# Patient Record
Sex: Male | Born: 1945 | Race: White | Hispanic: No | Marital: Married | Smoking: Former smoker
Health system: Southern US, Community
[De-identification: ages and names within clinical notes are randomized; demographics above are authoritative.]

## PROBLEM LIST (undated history)

## (undated) DIAGNOSIS — I10 Essential (primary) hypertension: Secondary | ICD-10-CM

## (undated) HISTORY — PX: ACOUSTIC NEUROMA RESECTION: SHX5713

## (undated) HISTORY — PX: EYE SURGERY: SHX253

## (undated) HISTORY — PX: MEDIAL PARTIAL KNEE REPLACEMENT: SHX5965

## (undated) HISTORY — DX: Essential (primary) hypertension: I10

## (undated) HISTORY — PX: SHOULDER SURGERY: SHX246

---

## 2019-01-26 ENCOUNTER — Other Ambulatory Visit: Payer: Self-pay

## 2019-01-26 ENCOUNTER — Ambulatory Visit: Payer: Self-pay | Attending: Internal Medicine

## 2019-01-26 DIAGNOSIS — Z20822 Contact with and (suspected) exposure to covid-19: Secondary | ICD-10-CM

## 2019-01-26 DIAGNOSIS — Z20828 Contact with and (suspected) exposure to other viral communicable diseases: Secondary | ICD-10-CM | POA: Insufficient documentation

## 2019-01-27 NOTE — Progress Notes (Signed)
Order(s) created erroneously. Erroneous order ID: [id number]  Order moved by: Brigitte Pulse  Order move date/time: 01/27/2019 2:39 PM  Source Patient: [id number]  Source Contact: 01/26/2019  Destination Patient: [id number]  Destination Contact: 01/26/2019

## 2019-01-27 NOTE — Progress Notes (Signed)
Order(s) created erroneously. Erroneous order ID: [id number]  Order moved by: Haru Shaff M  Order move date/time: 01/27/2019 2:39 PM  Source Patient: [id number]  Source Contact: 01/26/2019  Destination Patient: [id number]  Destination Contact: 01/26/2019 

## 2019-01-27 NOTE — Progress Notes (Signed)
Orders moved to this encounter.  

## 2019-01-28 LAB — NOVEL CORONAVIRUS, NAA: SARS-CoV-2, NAA: NOT DETECTED

## 2019-11-27 ENCOUNTER — Ambulatory Visit: Payer: Self-pay | Attending: Internal Medicine

## 2019-11-27 ENCOUNTER — Ambulatory Visit: Payer: Self-pay

## 2019-11-27 DIAGNOSIS — Z23 Encounter for immunization: Secondary | ICD-10-CM

## 2019-11-27 NOTE — Progress Notes (Signed)
   Covid-19 Vaccination Clinic  Name:  Angel Henry    MRN: [id number] DOB: 09-07-1945  11/27/2019  [name] was observed post Covid-19 immunization for 15 minutes without incident. He was provided with Vaccine Information Sheet and instruction to access the V-Safe system.   [name] was instructed to call 911 with any severe reactions post vaccine: Marland Kitchen Difficulty breathing  . Swelling of face and throat  . A fast heartbeat  . A bad rash all over body  . Dizziness and weakness

## 2020-04-29 ENCOUNTER — Ambulatory Visit (INDEPENDENT_AMBULATORY_CARE_PROVIDER_SITE_OTHER): Payer: Medicare Other | Admitting: Internal Medicine

## 2020-04-29 VITALS — BP 156/74 | HR 63 | Temp 98.8°F | Resp 18 | Ht 66.0 in | Wt 242.0 lb

## 2020-04-29 DIAGNOSIS — Z6839 Body mass index (BMI) 39.0-39.9, adult: Secondary | ICD-10-CM

## 2020-04-29 DIAGNOSIS — Z7189 Other specified counseling: Secondary | ICD-10-CM

## 2020-04-29 DIAGNOSIS — G4733 Obstructive sleep apnea (adult) (pediatric): Secondary | ICD-10-CM | POA: Diagnosis not present

## 2020-04-29 DIAGNOSIS — E669 Obesity, unspecified: Secondary | ICD-10-CM | POA: Diagnosis not present

## 2020-04-29 DIAGNOSIS — Z9989 Dependence on other enabling machines and devices: Secondary | ICD-10-CM

## 2020-04-29 NOTE — Patient Instructions (Signed)

## 2020-04-29 NOTE — Progress Notes (Signed)
[facility] [address] [place], [place] [address]  Pulmonary Sleep Medicine   Office Visit Note  Patient Name: Angel Henry DOB: July 25, 1945 MRN [id number]    Chief Complaint: Obstructive Sleep Apnea visit  Brief History:  [name] is seen today for follow up  The patient has a 14 history of sleep apnea. Patient is using PAP most nights. He is still falling asleep in the living room but this has improved. The patient feels more rested after sleeping with PAP.  The patient reports benefiting from PAP use. Reported sleepiness is  imiproved and the Epworth Sleepiness Score is 10 out of 24. The patient does not take naps. The patient complains of the following: the headgear slipes  The compliance download shows good compliance with an average use time of 7.2 hours. The AHI is 2.4  The patient does not complain of limb movements disrupting sleep.  ROS  General: (-) fever, (-) chills, (-) night sweat Nose and Sinuses: (-) nasal stuffiness or itchiness, (-) postnasal drip, (-) nosebleeds, (-) sinus trouble. Mouth and Throat: (-) sore throat, (-) hoarseness. Neck: (-) swollen glands, (-) enlarged thyroid, (-) neck pain. Respiratory: - cough, - shortness of breath, - wheezing. Neurologic: - numbness, - tingling. Psychiatric: he is on antidepressant    Current Medication: Outpatient Encounter Medications as of 04/29/2020  Medication Sig   Cholecalciferol 25 MCG (1000 UT) tablet Take by mouth.   citalopram (CELEXA) 10 MG/5ML suspension Take by mouth.   tamsulosin (FLOMAX) 0.4 MG CAPS capsule tamsulosin 0.4 mg capsule   No facility-administered encounter medications on file as of 04/29/2020.    Surgical History: Past Surgical History:  Procedure Laterality Date   ACOUSTIC NEUROMA RESECTION     MEDIAL PARTIAL KNEE REPLACEMENT     SHOULDER SURGERY      Medical History: Past Medical History:  Diagnosis Date   Hypertension     Family History: Non contributory to  the present illness  Social History: Social History   Socioeconomic History   Marital status: Married    Spouse name: Not on file   Number of children: Not on file   Years of education: Not on file   Highest education level: Not on file  Occupational History   Not on file  Tobacco Use   Smoking status: Former Smoker   Smokeless tobacco: Never Used  Substance and Sexual Activity   Alcohol use: Not on file   Drug use: Not on file   Sexual activity: Not on file  Other Topics Concern   Not on file  Social History Narrative   Not on file   Social Determinants of Health   Financial Resource Strain: Not on file  Food Insecurity: Not on file  Transportation Needs: Not on file  Physical Activity: Not on file  Stress: Not on file  Social Connections: Not on file  Intimate Partner Violence: Not on file    Vital Signs: Blood pressure (!) 156/74, pulse 63, temperature 98.8 F (37.1 C), temperature source Temporal, resp. rate 18, height 5\' 6"  (1.676 m), weight 242 lb (109.8 kg), SpO2 95 %.  Examination: General Appearance: The patient is well-developed, well-nourished, and in no distress. Neck Circumference: 48 Skin: Gross inspection of skin unremarkable. Head: normocephalic, no gross deformities. Eyes: no gross deformities noted. ENT: ears appear grossly normal Neurologic: Alert and oriented. No involuntary movements.    EPWORTH SLEEPINESS SCALE:  Scale:  (0)= no chance of dozing; (1)= slight chance of dozing; (2)= moderate chance  of dozing; (3)= high chance of dozing  Chance  Situtation    Sitting and reading: 1    Watching TV: 2    Sitting Inactive in public: 2    As a passenger in car: 1      Lying down to rest: 2    Sitting and talking: 1    Sitting quielty after lunch: 1    In a car, stopped in traffic: 0   TOTAL SCORE:   10 out of 24    SLEEP STUDIES:  1. PSG (01/18/06) - AHI of 88;  Low SpO2 @ 64%   CPAP COMPLIANCE  DATA:  Date Range: 04/27/19 - 04/25/20  Average Daily Use: 6.. hours  Median Use: 6:20  Compliance for > 4 Hours: 80%  AHI: 2.4 respiratory events per hour  Days Used: 306/365  Mask Leak: 47.1  95th Percentile Pressure: 11 cmH2O         LABS: No results found for this or any previous visit (from the past 2160 hour(s)).  Radiology: Patient was never admitted.  No results found.  No results found.    Assessment and Plan: Patient Active Problem List   Diagnosis Date Noted   OSA on CPAP 04/29/2020   CPAP use counseling 04/29/2020   Class 2 severe obesity with serious comorbidity and body mass index (BMI) of 39.0 to 39.9 in adult (HCC) 04/29/2020   1. OSA on CPAP The patient does tolerate PAP and reports significant benefit from PAP use. The patient was reminded how to clean the CPAP. The patient was also counselled to use a timer when watching TV in the living room to prevent sleeping without the mask  He was advised to fit the mask on his side so he can  Sleep on his side without mask leak.The compliance has been good and the apnea is controlled OSA- continue to aim for nightly use of CPAP  2. CPAP use counseling CPAP Counseling: had a lengthy discussion with the patient regarding the importance of PAP therapy in management of the sleep apnea. Patient appears to understand the risk factor reduction and also understands the risks associated with untreated sleep apnea. Patient will try to make a good faith effort to remain compliant with therapy. Also instructed the patient on proper cleaning of the device including the water must be changed daily if possible and use of distilled water is preferred. Patient understands that the machine should be regularly cleaned with appropriate recommended cleaning solutions that do not damage the PAP machine for example given white vinegar and water rinses. Other methods such as ozone treatment may not be as good as these simple methods  to achieve cleaning.  3. Class 2 severe obesity with serious comorbidity and body mass index (BMI) of 39.0 to 39.9 in adult, unspecified obesity type (HCC) Obesity Counseling: Had a lengthy discussion regarding patients BMI and weight issues. Patient was instructed on portion control as well as increased activity. Also discussed caloric restrictions with trying to maintain intake less than 2000 Kcal. Discussions were made in accordance with the 5As of weight management. Simple actions such as not eating late and if able to, taking a walk is suggested.  General Counseling: I have discussed the findings of the evaluation and examination with Wallie.  I have also discussed any further diagnostic evaluation thatmay be needed or ordered today. Josip verbalizes understanding of the findings of todays visit. We also reviewed his medications today and discussed drug interactions and side effects  including but not limited excessive drowsiness and altered mental states. We also discussed that there is always a risk not just to him but also people around him. he has been encouraged to call the office with any questions or concerns that should arise related to todays visit.  No orders of the defined types were placed in this encounter.       I have personally obtained a history, examined the patient, evaluated laboratory and imaging results, formulated the assessment and plan and placed orders.  This patient was seen today by Emmaline Kluver, PA-C in collaboration with Dr. Freda Munro.  Valentino Hue Sol Blazing, PhD, FAASM  Diplomate, American Board of Sleep Medicine    Yevonne Pax, MD [facility] Diplomate ABMS Pulmonary and Critical Care Medicine Sleep medicine

## 2021-01-09 DIAGNOSIS — M179 Osteoarthritis of knee, unspecified: Secondary | ICD-10-CM | POA: Insufficient documentation

## 2021-01-13 ENCOUNTER — Other Ambulatory Visit: Payer: Self-pay | Admitting: Orthopedic Surgery

## 2021-01-13 ENCOUNTER — Other Ambulatory Visit (HOSPITAL_COMMUNITY): Payer: Self-pay | Admitting: Orthopedic Surgery

## 2021-01-13 DIAGNOSIS — M4807 Spinal stenosis, lumbosacral region: Secondary | ICD-10-CM

## 2021-01-24 ENCOUNTER — Other Ambulatory Visit: Payer: Self-pay

## 2021-01-24 ENCOUNTER — Ambulatory Visit
Admission: RE | Admit: 2021-01-24 | Discharge: 2021-01-24 | Disposition: A | Discharge status: Home / Self Care | Payer: Medicare Other | Source: Ambulatory Visit | Attending: Orthopedic Surgery | Admitting: Orthopedic Surgery

## 2021-01-24 DIAGNOSIS — M4807 Spinal stenosis, lumbosacral region: Secondary | ICD-10-CM

## 2021-04-28 ENCOUNTER — Ambulatory Visit (INDEPENDENT_AMBULATORY_CARE_PROVIDER_SITE_OTHER): Payer: Medicare Other | Admitting: Internal Medicine

## 2021-04-28 VITALS — BP 150/70 | HR 54 | Resp 16 | Ht 66.0 in | Wt 252.0 lb

## 2021-04-28 DIAGNOSIS — G4733 Obstructive sleep apnea (adult) (pediatric): Secondary | ICD-10-CM

## 2021-04-28 DIAGNOSIS — Z9989 Dependence on other enabling machines and devices: Secondary | ICD-10-CM | POA: Diagnosis not present

## 2021-04-28 DIAGNOSIS — Z7189 Other specified counseling: Secondary | ICD-10-CM | POA: Diagnosis not present

## 2021-04-28 NOTE — Patient Instructions (Signed)

## 2021-04-28 NOTE — Progress Notes (Signed)
[facility] ?[address] ?[place], [place] [address] ? ?Pulmonary Sleep Medicine  ? ?Office Visit Note ? ?Patient Name: Angel Henry ?DOB: Oct 09, 1945 ?MRN [id number] ? ? ? ?Chief Complaint: Obstructive Sleep Apnea visit ? ?Brief History: ? ?[name] is seen today for follow up visit. The patient has a 15 year history of sleep apnea. Patient is mostly using PAP nightly.  The patient feels a whole lot better after sleeping with PAP on CPAP @ 11cmH2O on [place] pillows mask, no chinstrap..  The patient reports benefiting from PAP use. Reported sleepiness is  resolved and the Epworth Sleepiness Score is 3 out of 24. The patient does not take naps. The patient complains of the following: headgear slips off head and some leak, but not too bad &  questions about replacement  The compliance download shows  compliance with an average use time of 5:48 hours @ 79%. The AHI is 3.4  The patient does not complain of limb movements disrupting sleep. ? ?ROS ? ?General: (-) fever, (-) chills, (-) night sweat ?Nose and Sinuses: (-) nasal stuffiness or itchiness, (-) postnasal drip, (-) nosebleeds, (-) sinus trouble. ?Mouth and Throat: (-) sore throat, (-) hoarseness. ?Neck: (-) swollen glands, (-) enlarged thyroid, (-) neck pain. ?Respiratory: - cough, - shortness of breath, - wheezing. ?Neurologic: - numbness, - tingling. ?Psychiatric: - anxiety, + depression ? ? ?Current Medication: ?Outpatient Encounter Medications as of 04/28/2021  ?Medication Sig  ? Cholecalciferol 25 MCG (1000 UT) tablet Take by mouth.  ? citalopram (CELEXA) 10 MG/5ML suspension Take by mouth.  ? citalopram (CELEXA) 20 MG tablet Take 30 mg by mouth every morning.  ? meloxicam (MOBIC) 15 MG tablet Take 15 mg by mouth daily.  ? tamsulosin (FLOMAX) 0.4 MG CAPS capsule tamsulosin 0.4 mg capsule  ? ?No facility-administered encounter medications on file as of 04/28/2021.  ? ? ?Surgical History: ?Past Surgical History:  ?Procedure Laterality Date  ? ACOUSTIC  NEUROMA RESECTION    ? MEDIAL PARTIAL KNEE REPLACEMENT    ? SHOULDER SURGERY    ? ? ?Medical History: ?Past Medical History:  ?Diagnosis Date  ? Hypertension   ? ? ?Family History: ?Non contributory to the present illness ? ?Social History: ?Social History  ? ?Socioeconomic History  ? Marital status: Married  ?  Spouse name: Not on file  ? Number of children: Not on file  ? Years of education: Not on file  ? Highest education level: Not on file  ?Occupational History  ? Not on file  ?Tobacco Use  ? Smoking status: Former  ? Smokeless tobacco: Never  ?Substance and Sexual Activity  ? Alcohol use: Not on file  ? Drug use: Not on file  ? Sexual activity: Not on file  ?Other Topics Concern  ? Not on file  ?Social History Narrative  ? Not on file  ? ?Social Determinants of Health  ? ?Financial Resource Strain: Not on file  ?Food Insecurity: Not on file  ?Transportation Needs: Not on file  ?Physical Activity: Not on file  ?Stress: Not on file  ?Social Connections: Not on file  ?Intimate Partner Violence: Not on file  ? ? ?Vital Signs: ?Blood pressure (!) 150/70, pulse (!) 54, resp. rate 16, height 5\' 6"  (1.676 m), weight 252 lb (114.3 kg), SpO2 98 %. ?Body mass index is 40.67 kg/m?.  ? ? ?Examination: ?General Appearance: The patient is well-developed, well-nourished, and in no distress. ?Neck Circumference: 48 cm ?Skin: Gross inspection of skin unremarkable. ?Head: normocephalic,  no gross deformities. ?Eyes: no gross deformities noted. ?ENT: ears appear grossly normal ?Neurologic: Alert and oriented. No involuntary movements. ? ? ? ?EPWORTH SLEEPINESS SCALE: ? ?Scale:  ?(0)= no chance of dozing; (1)= slight chance of dozing; (2)= moderate chance of dozing; (3)= high chance of dozing ? ?Chance  Situtation ?   ?Sitting and reading: 1 ?  ? Watching TV: 1 ?   ?Sitting Inactive in public: 0 ?   ?As a passenger in car: 0   ?   ?Lying down to rest: 1 ?   ?Sitting and talking: 0 ?   ?Sitting quielty after lunch: 0 ?   ?In a car,  stopped in traffic: 0 ? ? ?TOTAL SCORE:   3 out of 24 ? ? ? ?SLEEP STUDIES: ? ?PSG (01/18/06) - AHI of 88;  Low SpO2 @ 64% ? ? ?CPAP COMPLIANCE DATA: ? ?Date Range: 04/28/20 - 04/27/21 ? ?Average Daily Use: 5:48 hours ? ?Median Use: 7:11 hours ? ?Compliance for > 4 Hours: 79% days ? ?AHI: 3.4 respiratory events per hour ? ?Days Used: 302/365 ? ?Mask Leak: 43.6 lpm ? ?95th Percentile Pressure: 11 cmH2O ? ? ? ? ? ? ? ? ?LABS: ?No results found for this or any previous visit (from the past 2160 hour(s)). ? ?Radiology: ?MR LUMBAR SPINE WO CONTRAST ? ?Result Date: 01/26/2021 ?CLINICAL DATA:  Bilateral hip pain, left greater than right. Right groin pain. Chronic low back pain. Spinal stenosis of the lumbosacral region. EXAM: MRI LUMBAR SPINE WITHOUT CONTRAST TECHNIQUE: Multiplanar, multisequence MR imaging of the lumbar spine was performed. No intravenous contrast was administered. COMPARISON:  None. FINDINGS: Segmentation: 5 non rib-bearing lumbar type vertebral bodies are present. The lowest fully formed vertebral body is L5. Alignment: Slight retrolisthesis present at L1-2, L2-3 and L5-S1. Rightward curvature is centered at L3. Vertebrae: Mild fatty endplate marrow changes are present at L4-5 diffusely and on the far left L5-S1. Mild edema is present on the right at L2-3. Vertebral body heights are maintained. Conus medullaris and cauda equina: Conus extends to the L1-2 level. Conus and cauda equina appear normal. Paraspinal and other soft tissues: Posterior right renal cyst with a fluid level measures 2.4 cm. No other focal lesions are present. No significant adenopathy is present. Disc levels: L1-2: A broad-based disc protrusion is present. Disc material extends above and below the disc space, spanning 17 mm cephalo caudad. This results in moderate central canal stenosis with crowding of the nerve roots. Moderate foraminal narrowing is present bilaterally. L2-3: A leftward disc protrusion is present. Moderate facet  hypertrophy present. Crowding of the nerve roots is noted. Moderate left and mild right subarticular narrowing is present. Moderate foraminal narrowing is symmetric. L3-4: A broad-based disc protrusion is present. Advanced facet hypertrophy is noted. Moderate to severe central canal stenosis is present with near complete loss of fluid signal at the disc level. Moderate foraminal narrowing is present bilaterally. L4-5: A broad-based disc protrusion is present. Moderate subarticular scratched at moderate left and mild right subarticular narrowing is present without nerve root crowding as at the above levels. Moderate foraminal narrowing is metric. Facet spurring contributes, right greater than left. L5-S1: Chronic loss of disc height is present. Endplate spurring is noted. Facet spur tearing scratched at facet spurring contributes to moderate foraminal narrowing, right greater than left. IMPRESSION: 1. 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 Scoliosis is convex to the right. 2. 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 3. Moderate left and mild right subarticular stenosis at L2-3 with moderate foraminal narrowing bilaterally. 4. Symmetric moderate foraminal stenosis bilaterally at L2-3, L3-4 and L4-5 5. Moderate foraminal narrowing bilaterally at L5-S1 is worse on the right. Electronically Signed   By: San Morelle M.D.   On: 01/26/2021 07:12  ? ? ?No results found. ? ?No results found. ? ? ? ?Assessment and Plan: ?Patient Active Problem List  ? Diagnosis Date Noted  ? Morbid obesity ([place]) 04/28/2021  ? OSA on CPAP 04/29/2020  ? CPAP use counseling 04/29/2020  ? Class 2 severe obesity with serious comorbidity and body mass index (BMI) of 39.0 to 39.9 in adult [facility]) 04/29/2020  ? ?1. OSA on CPAP ?The patient does tolerate PAP and reports  benefit from PAP use. Machine has reached end of life and must be  replaced. The patient was reminded how to clean equipment and advised to replace supplies routinely. The patient was also counselled on weight loss. The compliance is good. The AHI is 3.4 ? ? ?OSA- rep

## 2021-07-21 ENCOUNTER — Ambulatory Visit (INDEPENDENT_AMBULATORY_CARE_PROVIDER_SITE_OTHER): Payer: Medicare Other | Admitting: Internal Medicine

## 2021-07-21 VITALS — BP 146/75 | HR 54 | Resp 16 | Ht 68.0 in | Wt 245.0 lb

## 2021-07-21 DIAGNOSIS — G4733 Obstructive sleep apnea (adult) (pediatric): Secondary | ICD-10-CM | POA: Diagnosis not present

## 2021-07-21 DIAGNOSIS — Z9989 Dependence on other enabling machines and devices: Secondary | ICD-10-CM

## 2021-07-21 DIAGNOSIS — Z7189 Other specified counseling: Secondary | ICD-10-CM | POA: Diagnosis not present

## 2021-07-21 NOTE — Progress Notes (Signed)
[facility] [place], [place] [address]  Pulmonary Sleep Medicine   Office Visit Note  Patient Name: Angel Henry DOB: Jul 25, 1945 MRN [id number]    Chief Complaint: Obstructive Sleep Apnea visit  Brief History:  Angel Henry is seen today for follow up. The patient has a 16 year history of sleep apnea. Patient is mostly using PAP nightly with medium N20 nasal mask..  The patient feels better after sleeping with PAP.  Epworth Sleepiness Score is 2 out of 24. The patient does not take naps. The patient complains of the following: frequent stuffy nose  and discomfort with full face.  The compliance download shows  compliance with an average use time of 5:20 hours. @ 80% The AHI is 5.6  The patient does not complain of limb movements disrupting sleep.  ROS  General: (-) fever, (-) chills, (-) night sweat Nose and Sinuses: (-) nasal stuffiness or itchiness, (-) postnasal drip, (-) nosebleeds, (-) sinus trouble. Mouth and Throat: (-) sore throat, (-) hoarseness. Neck: (-) swollen glands, (-) enlarged thyroid, (-) neck pain. Respiratory: - cough, - shortness of breath, - wheezing. Neurologic: - numbness, - tingling. Psychiatric: - anxiety, - depression   Current Medication: Outpatient Encounter Medications as of 07/21/2021  Medication Sig   Cholecalciferol 25 MCG (1000 UT) tablet Take by mouth.   citalopram (CELEXA) 10 MG/5ML suspension Take by mouth.   citalopram (CELEXA) 20 MG tablet Take 30 mg by mouth every morning.   gabapentin (NEURONTIN) 100 MG capsule Take 100 mg by mouth at bedtime.   meloxicam (MOBIC) 15 MG tablet Take 15 mg by mouth daily.   tamsulosin (FLOMAX) 0.4 MG CAPS capsule tamsulosin 0.4 mg capsule   No facility-administered encounter medications on file as of 07/21/2021.    Surgical History: Past Surgical History:  Procedure Laterality Date   ACOUSTIC NEUROMA RESECTION     MEDIAL PARTIAL KNEE REPLACEMENT     SHOULDER SURGERY      Medical  History: Past Medical History:  Diagnosis Date   Hypertension     Family History: Non contributory to the present illness  Social History: Social History   Socioeconomic History   Marital status: Married    Spouse name: Not on file   Number of children: Not on file   Years of education: Not on file   Highest education level: Not on file  Occupational History   Not on file  Tobacco Use   Smoking status: Former   Smokeless tobacco: Never  Substance and Sexual Activity   Alcohol use: Not on file   Drug use: Not on file   Sexual activity: Not on file  Other Topics Concern   Not on file  Social History Narrative   Not on file   Social Determinants of Health   Financial Resource Strain: Not on file  Food Insecurity: Not on file  Transportation Needs: Not on file  Physical Activity: Not on file  Stress: Not on file  Social Connections: Not on file  Intimate Partner Violence: Not on file    Vital Signs: Blood pressure (!) 146/75, pulse (!) 54, resp. rate 16, height 5\' 8"  (1.727 m), weight 245 lb (111.1 kg), SpO2 96 %. Body mass index is 37.25 kg/m.    Examination: General Appearance: The patient is well-developed, well-nourished, and in no distress. Neck Circumference: 48 cm Skin: Gross inspection of skin unremarkable. Head: normocephalic, no gross deformities. Eyes: no gross deformities noted. ENT: ears appear grossly normal Neurologic: Alert and  oriented. No involuntary movements.    EPWORTH SLEEPINESS SCALE:  Scale:  (0)= no chance of dozing; (1)= slight chance of dozing; (2)= moderate chance of dozing; (3)= high chance of dozing  Chance  Situtation    Sitting and reading: 1    Watching TV: 1    Sitting Inactive in public: 0    As a passenger in car: 0      Lying down to rest: 0    Sitting and talking: 0    Sitting quielty after lunch: 0    In a car, stopped in traffic: 0   TOTAL SCORE:   2 out of 24    SLEEP STUDIES:  PSG  (01/18/06) - AHI of 88;  Low SpO2 @ 64%   CPAP COMPLIANCE DATA:  Date Range: 05/14/21- 06/17/21 Average Daily Use:  hours  Median Use: 6hr69min  Compliance for > 4 Hours: 25 days  AHI: 2.7 respiratory events per hour  Days Used: 25/30  Mask Leak: 36.3  95th Percentile Pressure:          LABS: No results found for this or any previous visit (from the past 2160 hour(s)).  Radiology: MR LUMBAR SPINE WO CONTRAST  Result Date: 01/26/2021 CLINICAL DATA:  Bilateral hip pain, left greater than right. Right groin pain. Chronic low back pain. Spinal stenosis of the lumbosacral region. EXAM: MRI LUMBAR SPINE WITHOUT CONTRAST TECHNIQUE: Multiplanar, multisequence MR imaging of the lumbar spine was performed. No intravenous contrast was administered. COMPARISON:  None. FINDINGS: Segmentation: 5 non rib-bearing lumbar type vertebral bodies are present. The lowest fully formed vertebral body is L5. Alignment: Slight retrolisthesis present at L1-2, L2-3 and L5-S1. Rightward curvature is centered at L3. Vertebrae: Mild fatty endplate marrow changes are present at L4-5 diffusely and on the far left L5-S1. Mild edema is present on the right at L2-3. Vertebral body heights are maintained. Conus medullaris and cauda equina: Conus extends to the L1-2 level. Conus and cauda equina appear normal. Paraspinal and other soft tissues: Posterior right renal cyst with a fluid level measures 2.4 cm. No other focal lesions are present. No significant adenopathy is present. Disc levels: L1-2: A broad-based disc protrusion is present. Disc material extends above and below the disc space, spanning 17 mm cephalo caudad. This results in moderate central canal stenosis with crowding of the nerve roots. Moderate foraminal narrowing is present bilaterally. L2-3: A leftward disc protrusion is present. Moderate facet hypertrophy present. Crowding of the nerve roots is noted. Moderate left and mild right subarticular narrowing  is present. Moderate foraminal narrowing is symmetric. L3-4: A broad-based disc protrusion is present. Advanced facet hypertrophy is noted. Moderate to severe central canal stenosis is present with near complete loss of fluid signal at the disc level. Moderate foraminal narrowing is present bilaterally. L4-5: A broad-based disc protrusion is present. Moderate subarticular scratched at moderate left and mild right subarticular narrowing is present without nerve root crowding as at the above levels. Moderate foraminal narrowing is metric. Facet spurring contributes, right greater than left. L5-S1: Chronic loss of disc height is present. Endplate spurring is noted. Facet spur tearing scratched at facet spurring contributes to moderate foraminal narrowing, right greater than left. IMPRESSION: 1. 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 Scoliosis is convex to the right. 2. Moderate central canal stenosis at L1-2, L2-3 and L3-4 is greatest at L3-4 with near complete loss of fluid signal at the disc level. 3. Moderate left and mild right subarticular stenosis at L2-3  with moderate foraminal narrowing bilaterally. 4. Symmetric moderate foraminal stenosis bilaterally at L2-3, L3-4 and L4-5 5. Moderate foraminal narrowing bilaterally at L5-S1 is worse on the right. Electronically Signed   By: San Morelle M.D.   On: 01/26/2021 07:12    No results found.  No results found.    Assessment and Plan: Patient Active Problem List   Diagnosis Date Noted   Morbid obesity ([place]) 04/28/2021   OSA on CPAP 04/29/2020   CPAP use counseling 04/29/2020   Class 2 severe obesity with serious comorbidity and body mass index (BMI) of 39.0 to 39.9 in adult ([place]) 04/29/2020   1. OSA on CPAP The patient does tolerate PAP and reports  benefit from PAP use. The patient was reminded how to clean equipment and advised to replace supplies routinely. The patient was also counselled on weight loss. The compliance  is good. The AHI is 2.7.   OSA- CPAP continues to be medically necessary to treat this patient's OSA. Continue with good compliance with pap. F/u one year.    2. CPAP use counseling CPAP Counseling: had a lengthy discussion with the patient regarding the importance of PAP therapy in management of the sleep apnea. Patient appears to understand the risk factor reduction and also understands the risks associated with untreated sleep apnea. Patient will try to make a good faith effort to remain compliant with therapy. Also instructed the patient on proper cleaning of the device including the water must be changed daily if possible and use of distilled water is preferred. Patient understands that the machine should be regularly cleaned with appropriate recommended cleaning solutions that do not damage the PAP machine for example given white vinegar and water rinses. Other methods such as ozone treatment may not be as good as these simple methods to achieve cleaning.   3. Morbid obesity ([place]) Obesity Counseling: Had a lengthy discussion regarding patients BMI and weight issues. Patient was instructed on portion control as well as increased activity. Also discussed caloric restrictions with trying to maintain intake less than 2000 Kcal. Discussions were made in accordance with the 5As of weight management. Simple actions such as not eating late and if able to, taking a walk is suggested.    General Counseling: I have discussed the findings of the evaluation and examination with Angel Henry.  I have also discussed any further diagnostic evaluation thatmay be needed or ordered today. Angel Henry verbalizes understanding of the findings of todays visit. We also reviewed his medications today and discussed drug interactions and side effects including but not limited excessive drowsiness and altered mental states. We also discussed that there is always a risk not just to him but also people around him. he has been encouraged to  call the office with any questions or concerns that should arise related to todays visit.  No orders of the defined types were placed in this encounter.       I have personally obtained a history, examined the patient, evaluated laboratory and imaging results, formulated the assessment and plan and placed orders. This patient was seen today by Tressie Ellis, PA-C in collaboration with Dr. Devona Konig.   Angel Gee, MD [facility] Diplomate ABMS Pulmonary Critical Care Medicine and Sleep Medicine

## 2021-07-21 NOTE — Patient Instructions (Signed)

## 2022-07-20 ENCOUNTER — Ambulatory Visit (INDEPENDENT_AMBULATORY_CARE_PROVIDER_SITE_OTHER): Payer: Medicare Other | Admitting: Internal Medicine

## 2022-07-20 VITALS — BP 141/71 | HR 56 | Resp 16 | Ht 66.0 in | Wt 257.0 lb

## 2022-07-20 DIAGNOSIS — M545 Low back pain, unspecified: Secondary | ICD-10-CM | POA: Insufficient documentation

## 2022-07-20 DIAGNOSIS — Z7189 Other specified counseling: Secondary | ICD-10-CM | POA: Diagnosis not present

## 2022-07-20 DIAGNOSIS — Z8669 Personal history of other diseases of the nervous system and sense organs: Secondary | ICD-10-CM | POA: Insufficient documentation

## 2022-07-20 DIAGNOSIS — G4733 Obstructive sleep apnea (adult) (pediatric): Secondary | ICD-10-CM

## 2022-07-20 DIAGNOSIS — G4700 Insomnia, unspecified: Secondary | ICD-10-CM | POA: Diagnosis not present

## 2022-07-20 NOTE — Progress Notes (Unsigned)
[facility] [address] [place], [place] [address]  Pulmonary Sleep Medicine   Office Visit Note  Patient Name: Angel Henry DOB: 1945-12-09 MRN [id number]    Chief Complaint: Obstructive Sleep Apnea visit  Brief History:  Angel Henry is seen today for an annual follow up on CPAP at 11 cmh20.  The patient has a 17 year history of sleep apnea. Patient is using PAP nightly.  The patient feels rested after sleeping with PAP.  The patient reports benefiting from PAP use. Reported sleepiness is improved and the Epworth Sleepiness Score is 4 out of 24. The patient does not take naps. The patient complains of the following: Has issues with his nose being congested and can't breathe through it.  He thinks his mouth is dropping open and he has a dry mouth.  He has difficulty getting to sleep.  The compliance download shows 73% compliance with an average use time of 5:03 hours. The AHI is 3.1.  The patient does not complain of limb movements disrupting sleep.  ROS  General: (-) fever, (-) chills, (-) night sweat Nose and Sinuses: (-) nasal stuffiness or itchiness, (-) postnasal drip, (-) nosebleeds, (-) sinus trouble. Mouth and Throat: (-) sore throat, (-) hoarseness. Neck: (-) swollen glands, (-) enlarged thyroid, (-) neck pain. Respiratory: - cough, - shortness of breath, - wheezing. Neurologic: - numbness, - tingling. Psychiatric: - anxiety, - depression   Current Medication: Outpatient Encounter Medications as of 07/20/2022  Medication Sig   Cholecalciferol 25 MCG (1000 UT) tablet Take by mouth.   citalopram (CELEXA) 20 MG tablet Take 30 mg by mouth every morning.   gabapentin (NEURONTIN) 100 MG capsule Take 100 mg by mouth at bedtime.   meloxicam (MOBIC) 15 MG tablet Take 15 mg by mouth daily.   tamsulosin (FLOMAX) 0.4 MG CAPS capsule tamsulosin 0.4 mg capsule   [DISCONTINUED] citalopram (CELEXA) 10 MG/5ML suspension Take by mouth.   No facility-administered encounter  medications on file as of 07/20/2022.    Surgical History: Past Surgical History:  Procedure Laterality Date   ACOUSTIC NEUROMA RESECTION     MEDIAL PARTIAL KNEE REPLACEMENT     SHOULDER SURGERY      Medical History: Past Medical History:  Diagnosis Date   Hypertension     Family History: Non contributory to the present illness  Social History: Social History   Socioeconomic History   Marital status: Married    Spouse name: Not on file   Number of children: Not on file   Years of education: Not on file   Highest education level: Not on file  Occupational History   Not on file  Tobacco Use   Smoking status: Former   Smokeless tobacco: Never  Substance and Sexual Activity   Alcohol use: Not on file   Drug use: Not on file   Sexual activity: Not on file  Other Topics Concern   Not on file  Social History Narrative   Not on file   Social Determinants of Health   Financial Resource Strain: Not on file  Food Insecurity: Not on file  Transportation Needs: Not on file  Physical Activity: Not on file  Stress: Not on file  Social Connections: Not on file  Intimate Partner Violence: Not on file    Vital Signs: Blood pressure (!) 141/71, pulse (!) 56, resp. rate 16, height 5\' 6"  (1.676 m), weight 257 lb (116.6 kg), SpO2 96 %. Body mass index is 41.48 kg/m.    Examination: General  Appearance: The patient is well-developed, well-nourished, and in no distress. Neck Circumference: 50 cm Skin: Gross inspection of skin unremarkable. Head: normocephalic, no gross deformities. Eyes: no gross deformities noted. ENT: ears appear grossly normal Neurologic: Alert and oriented. No involuntary movements.  STOP BANG RISK ASSESSMENT S (snore) Have you been told that you snore?     YES   T (tired) Are you often tired, fatigued, or sleepy during the day?   YES  O (obstruction) Do you stop breathing, choke, or gasp during sleep? NO   P (pressure) Do you have or are you  being treated for high blood pressure? NO   B (BMI) Is your body index greater than 35 kg/m? YES   A (age) Are you 67 years old or older? YES   N (neck) Do you have a neck circumference greater than 16 inches?   YES   G (gender) Are you a male? YES   TOTAL STOP/BANG "YES" ANSWERS 6       A STOP-Bang score of 2 or less is considered low risk, and a score of 5 or more is high risk for having either moderate or severe OSA. For people who score 3 or 4, doctors may need to perform further assessment to determine how likely they are to have OSA.         EPWORTH SLEEPINESS SCALE:  Scale:  (0)= no chance of dozing; (1)= slight chance of dozing; (2)= moderate chance of dozing; (3)= high chance of dozing  Chance  Situtation    Sitting and reading: 0    Watching TV: 1    Sitting Inactive in public: 0    As a passenger in car: 0      Lying down to rest: 2    Sitting and talking: 0    Sitting quielty after lunch: 0    In a car, stopped in traffic: 1   TOTAL SCORE:   4 out of 24    SLEEP STUDIES:  PSG (01/18/06) AHI 88, min SPO2 64%   CPAP COMPLIANCE DATA:  Date Range: 07/15/21 - 07/14/22  Average Daily Use: 5:03 hours  Median Use: 6:22 hours  Compliance for > 4 Hours: 265 days  AHI: 3.1 respiratory events per hour  Days Used: 293/365  Mask Leak: 30.6  95th Percentile Pressure: 11 cmh20         LABS: No results found for this or any previous visit (from the past 2160 hour(s)).  Radiology: MR LUMBAR SPINE WO CONTRAST  Result Date: 01/26/2021 CLINICAL DATA:  Bilateral hip pain, left greater than right. Right groin pain. Chronic low back pain. Spinal stenosis of the lumbosacral region. EXAM: MRI LUMBAR SPINE WITHOUT CONTRAST TECHNIQUE: Multiplanar, multisequence MR imaging of the lumbar spine was performed. No intravenous contrast was administered. COMPARISON:  None. FINDINGS: Segmentation: 5 non rib-bearing lumbar type vertebral bodies are present. The  lowest fully formed vertebral body is L5. Alignment: Slight retrolisthesis present at L1-2, L2-3 and L5-S1. Rightward curvature is centered at L3. Vertebrae: Mild fatty endplate marrow changes are present at L4-5 diffusely and on the far left L5-S1. Mild edema is present on the right at L2-3. Vertebral body heights are maintained. Conus medullaris and cauda equina: Conus extends to the L1-2 level. Conus and cauda equina appear normal. Paraspinal and other soft tissues: Posterior right renal cyst with a fluid level measures 2.4 cm. No other focal lesions are present. No significant adenopathy is present. Disc levels: L1-2: A broad-based disc protrusion  is present. Disc material extends above and below the disc space, spanning 17 mm cephalo caudad. This results in moderate central canal stenosis with crowding of the nerve roots. Moderate foraminal narrowing is present bilaterally. L2-3: A leftward disc protrusion is present. Moderate facet hypertrophy present. Crowding of the nerve roots is noted. Moderate left and mild right subarticular narrowing is present. Moderate foraminal narrowing is symmetric. L3-4: A broad-based disc protrusion is present. Advanced facet hypertrophy is noted. Moderate to severe central canal stenosis is present with near complete loss of fluid signal at the disc level. Moderate foraminal narrowing is present bilaterally. L4-5: A broad-based disc protrusion is present. Moderate subarticular scratched at moderate left and mild right subarticular narrowing is present without nerve root crowding as at the above levels. Moderate foraminal narrowing is metric. Facet spurring contributes, right greater than left. L5-S1: Chronic loss of disc height is present. Endplate spurring is noted. Facet spur tearing scratched at facet spurring contributes to moderate foraminal narrowing, right greater than left. IMPRESSION: 1. 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 Scoliosis is convex to the  right. 2. Moderate central canal stenosis at L1-2, L2-3 and L3-4 is greatest at L3-4 with near complete loss of fluid signal at the disc level. 3. Moderate left and mild right subarticular stenosis at L2-3 with moderate foraminal narrowing bilaterally. 4. Symmetric moderate foraminal stenosis bilaterally at L2-3, L3-4 and L4-5 5. Moderate foraminal narrowing bilaterally at L5-S1 is worse on the right. Electronically Signed   By: Marin Roberts M.D.   On: 01/26/2021 07:12    No results found.  No results found.    Assessment and Plan: Patient Active Problem List   Diagnosis Date Noted   H/O retinal detachment 07/20/2022   Low back pain 07/20/2022   Insomnia 07/20/2022   Morbid obesity ([facility]) 04/28/2021   Osteoarthritis of knee 01/09/2021   OSA on CPAP 04/29/2020   CPAP use counseling 04/29/2020   Class 2 severe obesity with serious comorbidity and body mass index (BMI) of 39.0 to 39.9 in adult ([facility]) 04/29/2020   1. OSA on CPAP The patient does tolerate PAP and reports  benefit from PAP use. The patient was reminded how to clean equipment and advised to replace supplies routinely. The patient was also counselled on weight loss. The compliance is good. The AHI is 3.1.   OSA on cpap- controlled. Continue withgood compliance with pap. CPAP continues to be medically necessary to treat this patient's OSA. Mask fit for full face mask.  F/u one year.     2. CPAP use counseling CPAP Counseling: had a lengthy discussion with the patient regarding the importance of PAP therapy in management of the sleep apnea. Patient appears to understand the risk factor reduction and also understands the risks associated with untreated sleep apnea. Patient will try to make a good faith effort to remain compliant with therapy. Also instructed the patient on proper cleaning of the device including the water must be changed daily if possible and use of distilled water is preferred. Patient understands that the  machine should be regularly cleaned with appropriate recommended cleaning solutions that do not damage the PAP machine for example given white vinegar and water rinses. Other methods such as ozone treatment may not be as good as these simple methods to achieve cleaning.   3. Morbid obesity ([facility]) Obesity Counseling: Had a lengthy discussion regarding patients BMI and weight issues. Patient was instructed on portion control as well as increased activity. Also discussed  caloric restrictions with trying to maintain intake less than 2000 Kcal. Discussions were made in accordance with the 5As of weight management. Simple actions such as not eating late and if able to, taking a walk is suggested.   4. Insomnia, unspecified type We discussed sleep hygiene and sleep routine.     General Counseling: I have discussed the findings of the evaluation and examination with Andersson.  I have also discussed any further diagnostic evaluation thatmay be needed or ordered today. Jeziah verbalizes understanding of the findings of todays visit. We also reviewed his medications today and discussed drug interactions and side effects including but not limited excessive drowsiness and altered mental states. We also discussed that there is always a risk not just to him but also people around him. he has been encouraged to call the office with any questions or concerns that should arise related to todays visit.  No orders of the defined types were placed in this encounter.       I have personally obtained a history, examined the patient, evaluated laboratory and imaging results, formulated the assessment and plan and placed orders. This patient was seen today by Emmaline Kluver, PA-C in collaboration with Dr. Freda Munro.   Yevonne Pax, MD [facility] Diplomate ABMS Pulmonary Critical Care Medicine and Sleep Medicine

## 2022-07-20 NOTE — Patient Instructions (Signed)

## 2023-05-06 ENCOUNTER — Other Ambulatory Visit: Payer: Self-pay | Admitting: Family Medicine

## 2023-05-06 DIAGNOSIS — M5416 Radiculopathy, lumbar region: Secondary | ICD-10-CM

## 2023-05-08 ENCOUNTER — Ambulatory Visit
Admission: RE | Admit: 2023-05-08 | Discharge: 2023-05-08 | Disposition: A | Discharge status: Home / Self Care | Source: Ambulatory Visit | Attending: Family Medicine | Admitting: Family Medicine

## 2023-05-08 DIAGNOSIS — M5416 Radiculopathy, lumbar region: Secondary | ICD-10-CM

## 2023-06-09 IMAGING — MR MR LUMBAR SPINE W/O CM
4 of 5 series · 33 of 48 positions shown · non-contrast
Comparison: None.

CLINICAL DATA: Bilateral hip pain, left greater than right. Right
groin pain. Chronic low back pain. Spinal stenosis of the
lumbosacral region.

EXAM:
MRI LUMBAR SPINE WITHOUT CONTRAST
TECHNIQUE: Multiplanar, multisequence MR imaging of the lumbar spine was
performed. No intravenous contrast was administered.

[Series 5: T2 · sagittal · 4.0mm · 0.81mm/px · 8 of 17 slices shown (1 of 2)]
[im 1/17]
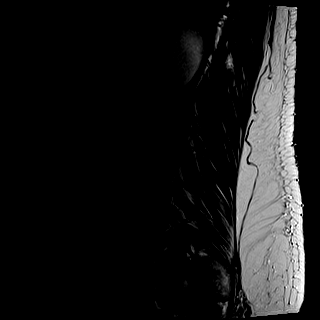
[im 3/17]
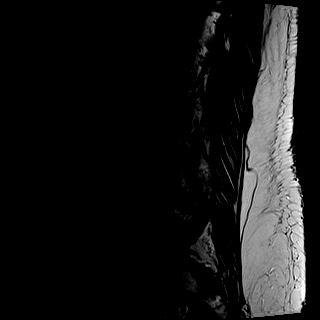
[im 5/17]
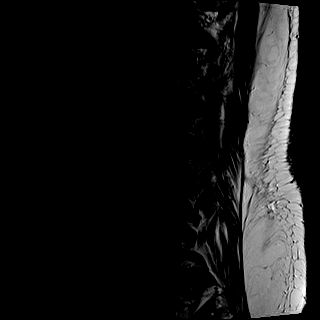
[im 7/17]
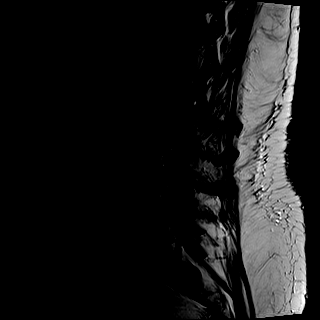
[im 10/17]
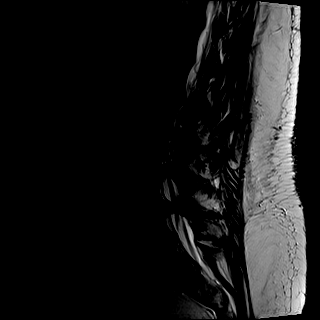
[im 12/17]
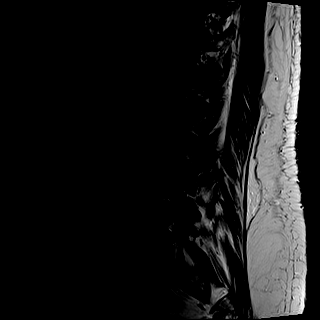
[im 14/17]
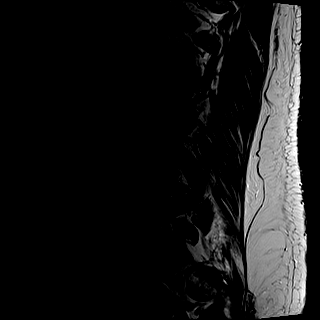
[im 17/17]
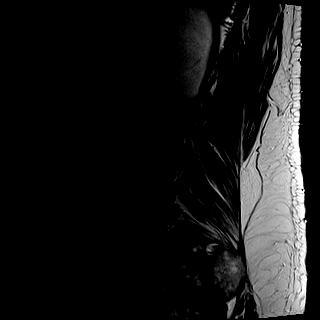

[Series 6: T1 · sagittal · 4.0mm · 0.81mm/px · 8 of 17 slices shown (1 of 2)]
[im 1/17]
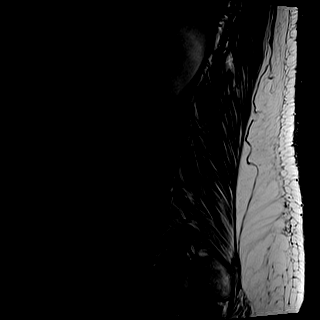
[im 3/17]
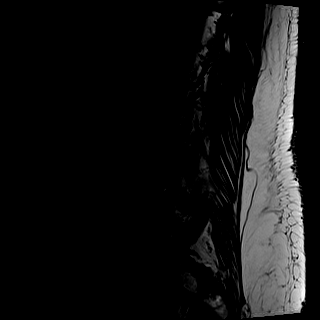
[im 5/17]
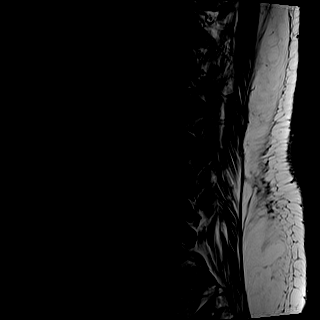
[im 7/17]
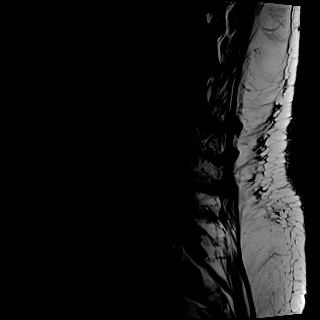
[im 10/17]
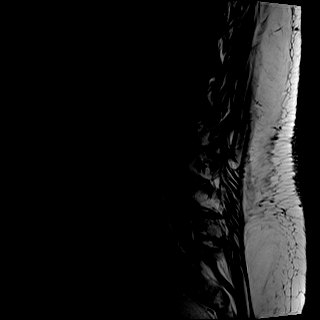
[im 12/17]
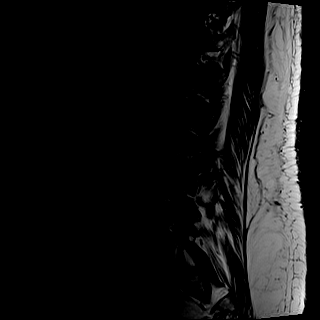
[im 14/17]
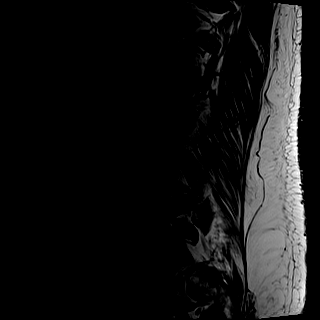
[im 17/17]
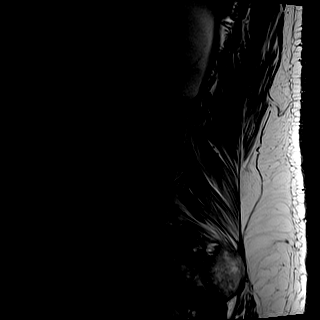

[Series 8: T2 · axial · 4.0mm · 0.78mm/px · z∈[-75,+103]mm · 9 of 27 slices shown (2 of 2)]
[im 1/27]
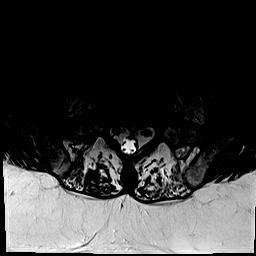
[im 5/27]
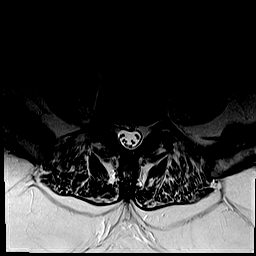
[im 8/27]
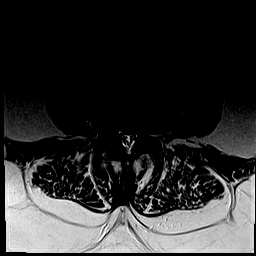
[im 12/27]
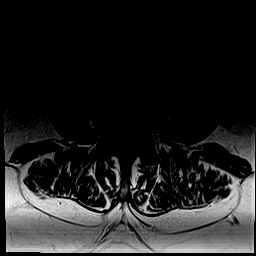
[im 15/27]
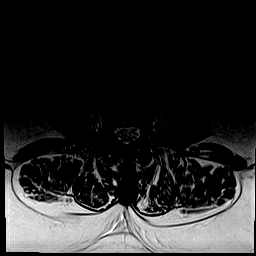
[im 19/27]
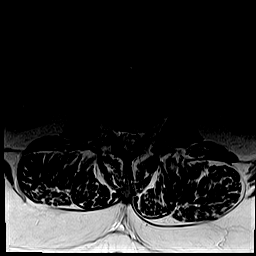
[im 22/27]
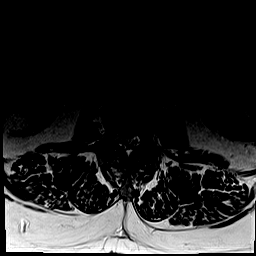
[im 24/27]
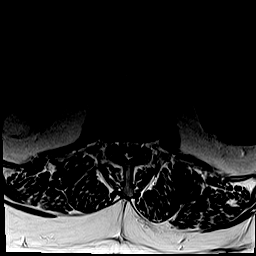
[im 27/27]
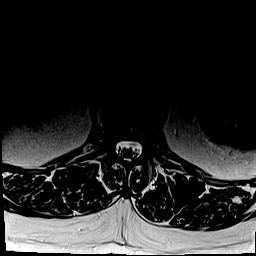

[Series 9: T1 · axial · 4.0mm · 0.39mm/px · z∈[-75,+89]mm · 8 of 27 slices shown (2 of 2)]
[im 1/27]
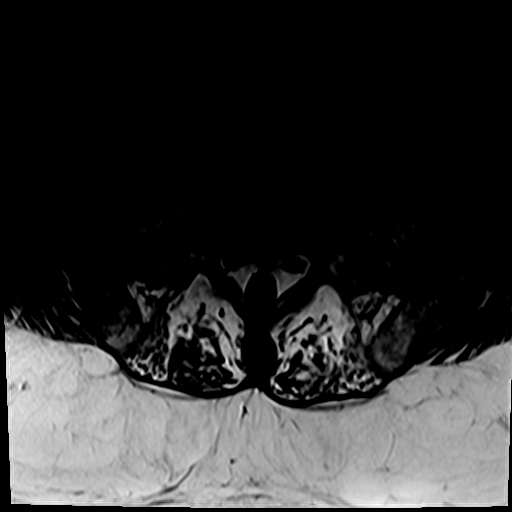
[im 5/27]
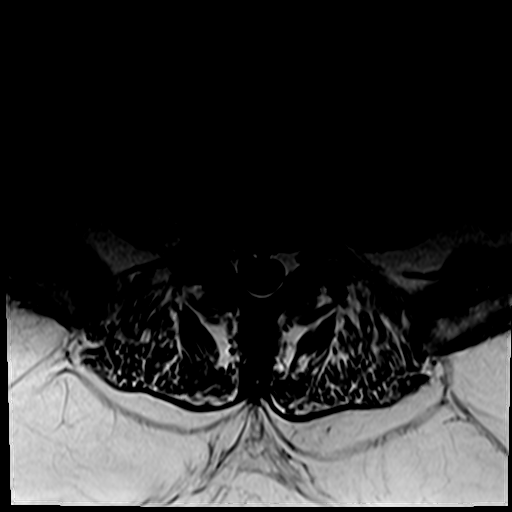
[im 8/27]
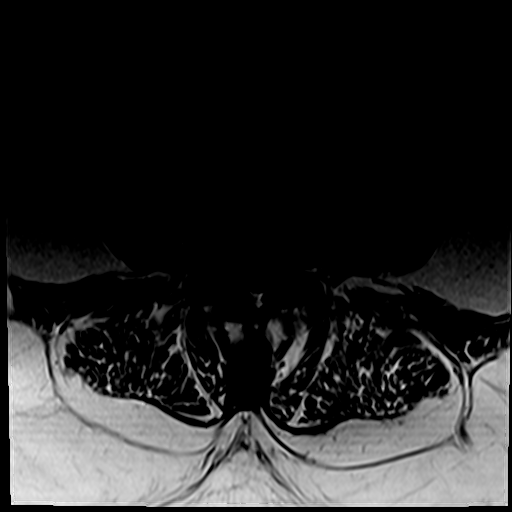
[im 12/27]
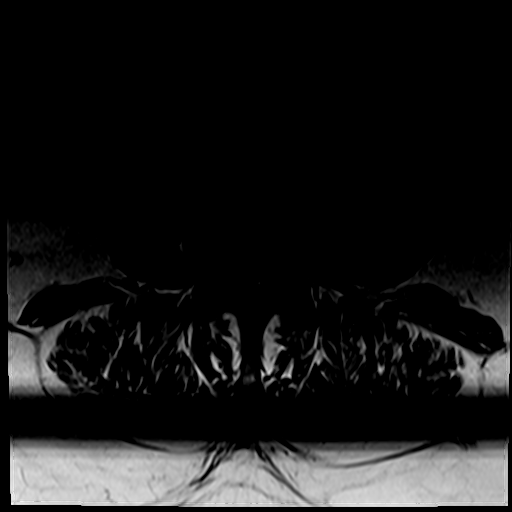
[im 15/27]
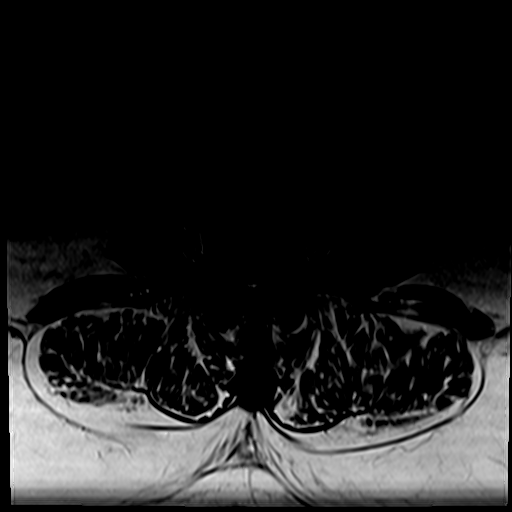
[im 19/27]
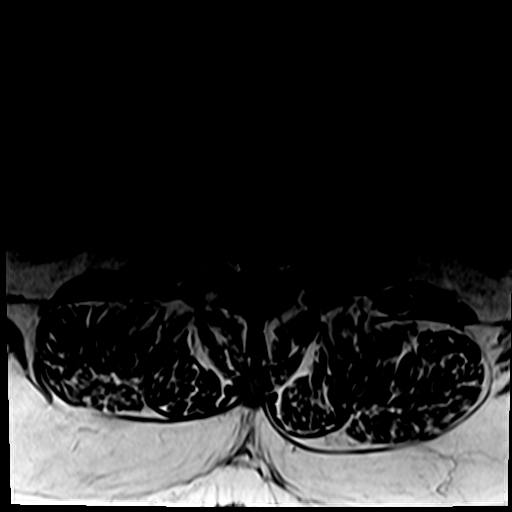
[im 22/27]
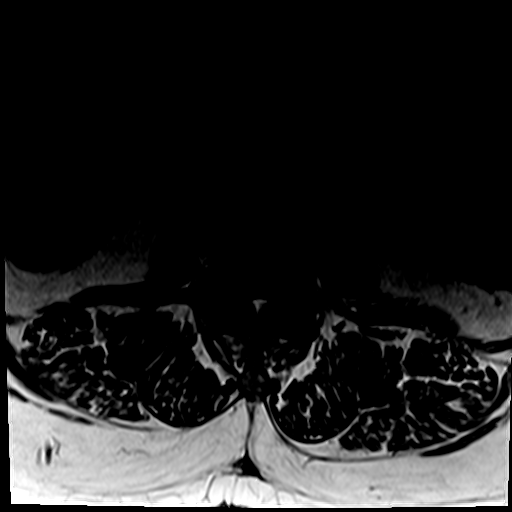
[im 24/27]
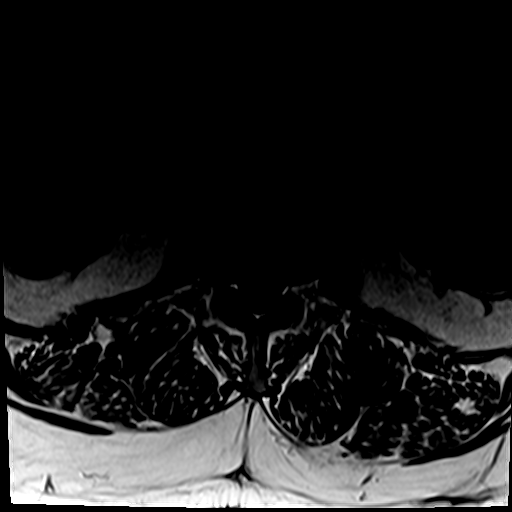

[33 of 48 positions shown; findings below may reference images not displayed]

FINDINGS: Segmentation: 5 non rib-bearing lumbar type vertebral bodies are
present. The lowest fully formed vertebral body is L5.

Alignment: Slight retrolisthesis present at L1-2, L2-3 and L5-S1.
Rightward curvature is centered at L3.

Vertebrae: Mild fatty endplate marrow changes are present at L4-5
diffusely and on the far left L5-S1. Mild edema is present on the
right at L2-3. Vertebral body heights are maintained.

Conus medullaris and cauda equina: Conus extends to the L1-2 level.
Conus and cauda equina appear normal.

Paraspinal and other soft tissues: Posterior right renal cyst with a
fluid level measures 2.4 cm. No other focal lesions are present. No
significant adenopathy is present.

Disc levels:

L1-2: A broad-based disc protrusion is present. Disc material
extends above and below the disc space, spanning 17 mm cephalo
caudad. This results in moderate central canal stenosis with
crowding of the nerve roots. Moderate foraminal narrowing is present
bilaterally.

L2-3: A leftward disc protrusion is present. Moderate facet
hypertrophy present. Crowding of the nerve roots is noted. Moderate
left and mild right subarticular narrowing is present. Moderate
foraminal narrowing is symmetric.

L3-4: A broad-based disc protrusion is present. Advanced facet
hypertrophy is noted. Moderate to severe central canal stenosis is
present with near complete loss of fluid signal at the disc level.
Moderate foraminal narrowing is present bilaterally.

L4-5: A broad-based disc protrusion is present. Moderate
subarticular scratched at moderate left and mild right subarticular
narrowing is present without nerve root crowding as at the above
levels. Moderate foraminal narrowing is metric. Facet spurring
contributes, right greater than left.

L5-S1: Chronic loss of disc height is present. Endplate spurring is
noted. Facet spur tearing scratched at facet spurring contributes to
moderate foraminal narrowing, right greater than left.
IMPRESSION: 1. Multilevel spondylosis of the lumbar spine as described.
Scoliosis is convex to the right.
2. Moderate central canal stenosis at L1-2, L2-3 and L3-4 is
greatest at L3-4 with near complete loss of fluid signal at the disc
level.
3. Moderate left and mild right subarticular stenosis at L2-3 with
moderate foraminal narrowing bilaterally.
4. Symmetric moderate foraminal stenosis bilaterally at L2-3, L3-4
and L4-5
5. Moderate foraminal narrowing bilaterally at L5-S1 is worse on the
right.

## 2023-07-16 NOTE — Progress Notes (Unsigned)
 [facility] [address] [place], [place] [address]  Pulmonary Sleep Medicine   Office Visit Note  Patient Name: Angel Henry DOB: 1945-09-15 MRN [id number]    Chief Complaint: Obstructive Sleep Apnea visit  Brief History:  Abdias is seen today for an annual follow up visit for CPAP@ 11 cmH2O. The patient has a 18 year history of sleep apnea. Patient is using PAP nightly.  The patient feels rested after sleeping with PAP.  The patient reports benefiting from PAP use. Reported sleepiness is improved and the Epworth Sleepiness Score is 5 out of 24. The patient does not take naps. The patient complains of the following: pt is taking care of elderly sick wife and will sleep later at night and will occasionally wake  up in the middle of the night to use the restroom.  The compliance download shows 49% compliance with an average use time of 5 hours 26 minutes. The AHI is 9.6.  The patient does not complain of limb movements disrupting sleep. The patient continues to require PAP therapy in order to eliminate sleep apnea.   ROS  General: (-) fever, (-) chills, (-) night sweat Nose and Sinuses: (-) nasal stuffiness or itchiness, (-) postnasal drip, (-) nosebleeds, (-) sinus trouble. Mouth and Throat: (-) sore throat, (-) hoarseness. Neck: (-) swollen glands, (-) enlarged thyroid, (-) neck pain. Respiratory: - cough, - shortness of breath, - wheezing. Neurologic: + numbness, + tingling. Psychiatric: - anxiety, - depression   Current Medication: Outpatient Encounter Medications as of 07/19/2023  Medication Sig   Cholecalciferol 25 MCG (1000 UT) tablet Take by mouth.   citalopram (CELEXA) 20 MG tablet Take 30 mg by mouth every morning.   meloxicam (MOBIC) 15 MG tablet Take 15 mg by mouth daily.   rosuvastatin (CRESTOR) 5 MG tablet Take 5 mg by mouth 3 (three) times a week.   tamsulosin (FLOMAX) 0.4 MG CAPS capsule tamsulosin 0.4 mg capsule   [DISCONTINUED] gabapentin (NEURONTIN)  100 MG capsule Take 100 mg by mouth at bedtime.   No facility-administered encounter medications on file as of 07/19/2023.    Surgical History: Past Surgical History:  Procedure Laterality Date   ACOUSTIC NEUROMA RESECTION     MEDIAL PARTIAL KNEE REPLACEMENT     SHOULDER SURGERY      Medical History: Past Medical History:  Diagnosis Date   Hypertension     Family History: Non contributory to the present illness  Social History: Social History   Socioeconomic History   Marital status: Married    Spouse name: Not on file   Number of children: Not on file   Years of education: Not on file   Highest education level: Not on file  Occupational History   Not on file  Tobacco Use   Smoking status: Former   Smokeless tobacco: Never  Substance and Sexual Activity   Alcohol use: Not on file   Drug use: Not on file   Sexual activity: Not on file  Other Topics Concern   Not on file  Social History Narrative   Not on file   Social Drivers of Health   Financial Resource Strain: Not on file  Food Insecurity: Not on file  Transportation Needs: Not on file  Physical Activity: Not on file  Stress: Not on file  Social Connections: Not on file  Intimate Partner Violence: Not on file    Vital Signs: Blood pressure (!) 158/78, pulse 70, resp. rate 16, height 5' 5.5" (1.664 m), weight  230 lb (104.3 kg), SpO2 97%. Body mass index is 37.69 kg/m.    Examination: General Appearance: The patient is well-developed, well-nourished, and in no distress. Neck Circumference: 50 cm Skin: Gross inspection of skin unremarkable. Head: normocephalic, no gross deformities. Eyes: no gross deformities noted. ENT: ears appear grossly normal Neurologic: Alert and oriented. No involuntary movements.  STOP BANG RISK ASSESSMENT S (snore) Have you been told that you snore?     YES   T (tired) Are you often tired, fatigued, or sleepy during the day?   YES  O (obstruction) Do you stop  breathing, choke, or gasp during sleep? NO   P (pressure) Do you have or are you being treated for high blood pressure? NO   B (BMI) Is your body index greater than 35 kg/m? YES   A (age) Are you 76 years old or older? YES   N (neck) Do you have a neck circumference greater than 16 inches?   YES   G (gender) Are you a male? YES   TOTAL STOP/BANG "YES" ANSWERS 6       A STOP-Bang score of 2 or less is considered low risk, and a score of 5 or more is high risk for having either moderate or severe OSA. For people who score 3 or 4, doctors may need to perform further assessment to determine how likely they are to have OSA.         EPWORTH SLEEPINESS SCALE:  Scale:  (0)= no chance of dozing; (1)= slight chance of dozing; (2)= moderate chance of dozing; (3)= high chance of dozing  Chance  Situtation    Sitting and reading: 1    Watching TV: 1    Sitting Inactive in public: 1    As a passenger in car: 0      Lying down to rest: 1    Sitting and talking: 0    Sitting quielty after lunch: 0    In a car, stopped in traffic: 1   TOTAL SCORE:   5 out of 24    SLEEP STUDIES:  PSG (01/2006) AHI 88, min SPO2 64%  Titration (01/2006) CPAP@ 13 cmH2O Split Study (04/2013) AHI 88/hr, min SpO2 73%, CPAP@ 11 cmH2O, PLM 80   CPAP COMPLIANCE DATA:  Date Range: 07/16/2022-07/15/2023  Average Daily Use: 5 hours 26 minutes  Median Use: 5 hours 28 minutes  Compliance for > 4 Hours: 49%  AHI: 9.6 respiratory events per hour  Days Used: 228/365 days  Mask Leak: 82.2  95th Percentile Pressure: 11         LABS: No results found for this or any previous visit (from the past 2160 hours).  Radiology: MR LUMBAR SPINE WO CONTRAST Result Date: 06/03/2023 CLINICAL DATA:  Low back pain EXAM: MRI LUMBAR SPINE WITHOUT CONTRAST TECHNIQUE: Multiplanar, multisequence MR imaging of the lumbar spine was performed. No intravenous contrast was administered. COMPARISON:  MRI of the  lumbar spine dated 01/26/2021 FINDINGS: Segmentation: Standard. Alignment:  Physiologic lumbar alignment is maintained. Vertebrae: Degenerative endplate marrow changes at a few levels. No compression fractures. Conus medullaris and cauda equina: The conus medullaris terminates at the level of L1-L2. The distal spinal cord signal intensity is normal. Paraspinal and other soft tissues: Right renal cyst. The visualized aorta is normal. Disc levels: L1-L2: Disc bulge. Mild bilateral facet arthropathy. Moderate bilateral neuroforaminal stenosis. Moderate spinal canal stenosis. L2-L3: Disc bulge. Moderate bilateral facet arthropathy. Moderate bilateral neuroforaminal stenosis. Moderate spinal canal stenosis. L3-L4: Disc  bulge. Severe bilateral facet arthropathy. Moderate bilateral neuroforaminal stenosis. Severe spinal canal stenosis. L4-L5: Disc bulge. Severe bilateral facet arthropathy. Moderate bilateral neuroforaminal stenosis. Moderate spinal canal stenosis. L5-S1: Disc bulge. Moderate bilateral facet arthropathy. Severe bilateral neuroforaminal stenosis. No spinal canal stenosis. IMPRESSION: 1. 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 Moderate canal stenosis at several levels in the lumbar spine. 2. Moderate and severe foraminal stenosis throughout the lumbar spine secondary to disc bulging and facet arthropathy. Electronically Signed   By: Johnanna Mylar M.D.   On: 06/03/2023 11:49    No results found.  No results found.    Assessment and Plan: Patient Active Problem List   Diagnosis Date Noted   H/O retinal detachment 07/20/2022   Low back pain 07/20/2022   Insomnia 07/20/2022   Morbid obesity (HCC) 04/28/2021   Osteoarthritis of knee 01/09/2021   OSA on CPAP 04/29/2020   CPAP use counseling 04/29/2020   Class 2 severe obesity with serious comorbidity and body mass index (BMI) of 39.0 to 39.9 in adult (HCC) 04/29/2020   1. OSA on CPAP (Primary) The patient  does tolerate PAP and reports  benefit from PAP use. He has not been using his cpap consistently, which he attributes to caregiving duties for his wife. He is often so exhausted he falls asleep in front of the tv. He has very high mask leak. He has not gotten new supplies in a year. I encouraged him to replace his mask and seals at regular intervals and reviewed cleaning instructions with him. His apnea is not controlled, I will change him to apap 8-15 and we will follow up in one month. He will increase his compliance.    2. CPAP use counseling CPAP Counseling: had a lengthy discussion with the patient regarding the importance of PAP therapy in management of the sleep apnea. Patient appears to understand the risk factor reduction and also understands the risks associated with untreated sleep apnea. Patient will try to make a good faith effort to remain compliant with therapy. Also instructed the patient on proper cleaning of the device including the water must be changed daily if possible and use of distilled water is preferred. Patient understands that the machine should be regularly cleaned with appropriate recommended cleaning solutions that do not damage the PAP machine for example given white vinegar and water rinses. Other methods such as ozone treatment may not be as good as these simple methods to achieve cleaning.     General Counseling: I have discussed the findings of the evaluation and examination with Solly.  I have also discussed any further diagnostic evaluation thatmay be needed or ordered today. Benito verbalizes understanding of the findings of todays visit. We also reviewed his medications today and discussed drug interactions and side effects including but not limited excessive drowsiness and altered mental states. We also discussed that there is always a risk not just to him but also people around him. he has been encouraged to call the office with any questions or concerns that should  arise related to todays visit.  No orders of the defined types were placed in this encounter.       I have personally obtained a history, examined the patient, evaluated laboratory and imaging results, formulated the assessment and plan and placed orders. This patient was seen today by Louann Rous, PA-C in collaboration with Dr. Cam Cava.   Cordie Deters, MD [facility] Diplomate ABMS Pulmonary Critical Care Medicine and Sleep  Medicine

## 2023-07-19 ENCOUNTER — Ambulatory Visit (INDEPENDENT_AMBULATORY_CARE_PROVIDER_SITE_OTHER): Admitting: Internal Medicine

## 2023-07-19 VITALS — BP 158/78 | HR 70 | Resp 16 | Ht 65.5 in | Wt 230.0 lb

## 2023-07-19 DIAGNOSIS — G4733 Obstructive sleep apnea (adult) (pediatric): Secondary | ICD-10-CM

## 2023-07-19 DIAGNOSIS — Z7189 Other specified counseling: Secondary | ICD-10-CM

## 2023-07-19 NOTE — Patient Instructions (Signed)

## 2023-09-10 NOTE — Progress Notes (Signed)
 [facility] [address] [place], [place] [address]  Pulmonary Sleep Medicine   Office Visit Note  Patient Name: Angel Henry DOB: 17-Nov-1945 MRN [id number]    Chief Complaint: Obstructive Sleep Apnea visit  Brief History:  [name] is seen today for a follow up visit for APAP@ 8-15 cmH2O. The patient has a 18 year history of sleep apnea. Patient is not using PAP nightly.  The patient feels rested after sleeping with PAP.  The patient reports benefiting from PAP use. Reported sleepiness is  improved and the Epworth Sleepiness Score is 13 out of 24. The patient does not take naps. The patient complains of the following: none.  The compliance download shows 56% compliance with an average use time of 5 hours 14 minutes. The AHI is 0.8.  The patient does not complain of limb movements disrupting sleep. The patient continues to require PAP therapy in order to eliminate sleep apnea.   ROS  General: (-) fever, (-) chills, (-) night sweat Nose and Sinuses: (-) nasal stuffiness or itchiness, (-) postnasal drip, (-) nosebleeds, (-) sinus trouble. Mouth and Throat: (-) sore throat, (-) hoarseness. Neck: (-) swollen glands, (-) enlarged thyroid, (-) neck pain. Respiratory: - cough, - shortness of breath, - wheezing. Neurologic: + numbness, + tingling. Psychiatric: - anxiety, - depression   Current Medication: Outpatient Encounter Medications as of 09/13/2023  Medication Sig   Cholecalciferol 25 MCG (1000 UT) tablet Take by mouth.   citalopram (CELEXA) 20 MG tablet Take 30 mg by mouth every morning.   meloxicam (MOBIC) 15 MG tablet Take 15 mg by mouth daily.   rosuvastatin (CRESTOR) 5 MG tablet Take 5 mg by mouth 3 (three) times a week.   tamsulosin (FLOMAX) 0.4 MG CAPS capsule tamsulosin 0.4 mg capsule   No facility-administered encounter medications on file as of 09/13/2023.    Surgical History: Past Surgical History:  Procedure Laterality Date   ACOUSTIC NEUROMA RESECTION      MEDIAL PARTIAL KNEE REPLACEMENT     SHOULDER SURGERY      Medical History: Past Medical History:  Diagnosis Date   Hypertension     Family History: Non contributory to the present illness  Social History: Social History   Socioeconomic History   Marital status: Married    Spouse name: Not on file   Number of children: Not on file   Years of education: Not on file   Highest education level: Not on file  Occupational History   Not on file  Tobacco Use   Smoking status: Former   Smokeless tobacco: Never  Substance and Sexual Activity   Alcohol use: Not on file   Drug use: Not on file   Sexual activity: Not on file  Other Topics Concern   Not on file  Social History Narrative   Not on file   Social Drivers of Health   Financial Resource Strain: Not on file  Food Insecurity: Not on file  Transportation Needs: Not on file  Physical Activity: Not on file  Stress: Not on file  Social Connections: Not on file  Intimate Partner Violence: Not on file    Vital Signs: Blood pressure (!) 147/81, pulse 61, resp. rate 16, height 5' 4 (1.626 m), weight 236 lb (107 kg), SpO2 96%. Body mass index is 40.51 kg/m.    Examination: General Appearance: The patient is well-developed, well-nourished, and in no distress. Neck Circumference: 50 cm Skin: Gross inspection of skin unremarkable. Head: normocephalic, no gross deformities. Eyes:  no gross deformities noted. ENT: ears appear grossly normal Neurologic: Alert and oriented. No involuntary movements.  STOP BANG RISK ASSESSMENT S (snore) Have you been told that you snore?     YES   T (tired) Are you often tired, fatigued, or sleepy during the day?   YES  O (obstruction) Do you stop breathing, choke, or gasp during sleep? NO   P (pressure) Do you have or are you being treated for high blood pressure? NO   B (BMI) Is your body index greater than 35 kg/m? YES   A (age) Are you 76 years old or older? YES   N (neck)  Do you have a neck circumference greater than 16 inches?   YES   G (gender) Are you a male? YES   TOTAL STOP/BANG "YES" ANSWERS 6       A STOP-Bang score of 2 or less is considered low risk, and a score of 5 or more is high risk for having either moderate or severe OSA. For people who score 3 or 4, doctors may need to perform further assessment to determine how likely they are to have OSA.         EPWORTH SLEEPINESS SCALE:  Scale:  (0)= no chance of dozing; (1)= slight chance of dozing; (2)= moderate chance of dozing; (3)= high chance of dozing  Chance  Situtation    Sitting and reading: 2    Watching TV: 2    Sitting Inactive in public: 1    As a passenger in car: 1      Lying down to rest: 3    Sitting and talking: 1    Sitting quielty after lunch: 2    In a car, stopped in traffic: 1   TOTAL SCORE:   13 out of 24    SLEEP STUDIES:  PSG (01/2006) AHI 88, min SPO2 64%  Titration (01/2006) CPAP@ 13 cmH2O Split Study (04/2013) AHI 88/hr, min SpO2 73%, CPAP@ 11 cmH2O, PLM 80   CPAP COMPLIANCE DATA:  Date Range: 07/20/2023-09/09/2023  Average Daily Use: 5 hours 14 minutes  Median Use: 5 hours 21 minutes  Compliance for > 4 Hours: 56%  AHI: 0.8 respiratory events per hour  Days Used: 37/52 days  Mask Leak: 51.3  95th Percentile Pressure: 13.8         LABS: No results found for this or any previous visit (from the past 2160 hours).  Radiology: MR LUMBAR SPINE WO CONTRAST Result Date: 06/03/2023 CLINICAL DATA:  Low back pain EXAM: MRI LUMBAR SPINE WITHOUT CONTRAST TECHNIQUE: Multiplanar, multisequence MR imaging of the lumbar spine was performed. No intravenous contrast was administered. COMPARISON:  MRI of the lumbar spine dated 01/26/2021 FINDINGS: Segmentation: Standard. Alignment:  Physiologic lumbar alignment is maintained. Vertebrae: Degenerative endplate marrow changes at a few levels. No compression fractures. Conus medullaris and cauda  equina: The conus medullaris terminates at the level of L1-L2. The distal spinal cord signal intensity is normal. Paraspinal and other soft tissues: Right renal cyst. The visualized aorta is normal. Disc levels: L1-L2: Disc bulge. Mild bilateral facet arthropathy. Moderate bilateral neuroforaminal stenosis. Moderate spinal canal stenosis. L2-L3: Disc bulge. Moderate bilateral facet arthropathy. Moderate bilateral neuroforaminal stenosis. Moderate spinal canal stenosis. L3-L4: Disc bulge. Severe bilateral facet arthropathy. Moderate bilateral neuroforaminal stenosis. Severe spinal canal stenosis. L4-L5: Disc bulge. Severe bilateral facet arthropathy. Moderate bilateral neuroforaminal stenosis. Moderate spinal canal stenosis. L5-S1: Disc bulge. Moderate bilateral facet arthropathy. Severe bilateral neuroforaminal stenosis. No spinal canal stenosis.  IMPRESSION: 1. 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 Moderate canal stenosis at several levels in the lumbar spine. 2. Moderate and severe foraminal stenosis throughout the lumbar spine secondary to disc bulging and facet arthropathy. Electronically Signed   By: Clem Savory M.D.   On: 06/03/2023 11:49    No results found.  No results found.    Assessment and Plan: Patient Active Problem List   Diagnosis Date Noted   H/O retinal detachment 07/20/2022   Low back pain 07/20/2022   Insomnia 07/20/2022   Morbid obesity (HCC) 04/28/2021   Osteoarthritis of knee 01/09/2021   OSA on CPAP 04/29/2020   CPAP use counseling 04/29/2020   Class 2 severe obesity with serious comorbidity and body mass index (BMI) of 39.0 to 39.9 in adult (HCC) 04/29/2020   1. OSA on CPAP (Primary) The patient does tolerate PAP and reports  benefit from PAP use. The patient was reminded how to clean equipment and advised to replace supplies routinely. The patient was also counselled on weight loss. The compliance is fair. We changed him to  APAP last visit as his apnea was not controlled. His apnea is now beautifully controlled on APAP 8-15 with an AHI of 0.8.    OSA on cpap- controlled. Increase compliance. F/u one year.   2. CPAP use counseling CPAP Counseling: had a lengthy discussion with the patient regarding the importance of PAP therapy in management of the sleep apnea. Patient appears to understand the risk factor reduction and also understands the risks associated with untreated sleep apnea. Patient will try to make a good faith effort to remain compliant with therapy. Also instructed the patient on proper cleaning of the device including the water must be changed daily if possible and use of distilled water is preferred. Patient understands that the machine should be regularly cleaned with appropriate recommended cleaning solutions that do not damage the PAP machine for example given white vinegar and water rinses. Other methods such as ozone treatment may not be as good as these simple methods to achieve cleaning.      General Counseling: I have discussed the findings of the evaluation and examination with Arlen.  I have also discussed any further diagnostic evaluation thatmay be needed or ordered today. Koleman verbalizes understanding of the findings of todays visit. We also reviewed his medications today and discussed drug interactions and side effects including but not limited excessive drowsiness and altered mental states. We also discussed that there is always a risk not just to him but also people around him. he has been encouraged to call the office with any questions or concerns that should arise related to todays visit.  No orders of the defined types were placed in this encounter.       I have personally obtained a history, examined the patient, evaluated laboratory and imaging results, formulated the assessment and plan and placed orders. This patient was seen today by Lauraine Lay, PA-C in collaboration with  Dr. Elfreda Bathe.   Elfreda DELENA Bathe, MD [facility] Diplomate ABMS Pulmonary Critical Care Medicine and Sleep Medicine

## 2023-09-13 ENCOUNTER — Ambulatory Visit (INDEPENDENT_AMBULATORY_CARE_PROVIDER_SITE_OTHER): Admitting: Internal Medicine

## 2023-09-13 VITALS — BP 147/81 | HR 61 | Resp 16 | Ht 64.0 in | Wt 236.0 lb

## 2023-09-13 DIAGNOSIS — Z7189 Other specified counseling: Secondary | ICD-10-CM

## 2023-09-13 DIAGNOSIS — G4733 Obstructive sleep apnea (adult) (pediatric): Secondary | ICD-10-CM | POA: Diagnosis not present

## 2023-09-13 NOTE — Patient Instructions (Signed)

## 2024-01-21 ENCOUNTER — Encounter: Payer: Self-pay | Admitting: Neurosurgery

## 2024-02-14 NOTE — Progress Notes (Signed)
 "   Referring Physician:  Kathlynn Sharper, MD [address] [facility] [place],  [place] [address]  Primary Physician:  Lawanda Rush, MD  History of Present Illness: 02/22/2024 Mr. Angel Henry is here today with a chief complaint of difficulty walking and discomfort in his legs.  This has been worsening over several years.  The last 3 months have been very bad.  He has tried some physical therapy without improvement.  His right is worse than his left.  He had a fall in November.  He is now having very difficult time with standing or walking.  Bowel/Bladder Dysfunction: none  Conservative measures:  Physical therapy: has participated in at [facility]-stopped going-possibly discharge Multimodal medical therapy including regular antiinflammatories:  meloxicam  Injections:  04/06/2023: Bilateral L4-5 transforaminal ESI  04/25/2021: Left L4-5 transforaminal ESI (75% relief)   Past Surgery: no spinal surgeries   Limuel Nieblas has no symptoms of cervical myelopathy.  The symptoms are causing a significant impact on the patient's life.   I have utilized the care everywhere function in epic to review the outside records available from external health systems.   Review of Systems:  A 10 point review of systems is negative, except for the pertinent positives and negatives detailed in the HPI.  Past Medical History: Past Medical History:  Diagnosis Date   Hypertension     Past Surgical History: Past Surgical History:  Procedure Laterality Date   ACOUSTIC NEUROMA RESECTION Right    EYE SURGERY     several surgeries-detached retinas both sides, cataracts   MEDIAL PARTIAL KNEE REPLACEMENT Bilateral    SHOULDER SURGERY     both shoulder rotator cuff repairs    Allergies: Allergies as of 02/22/2024 - Review Complete 07/20/2022  Allergen Reaction Noted   Codeine Nausea Only 04/29/2020   Morphine Other (See Comments) 04/29/2020   Oxycodone Other (See  Comments) 04/29/2020    Medications: Current Medications[1]  Social History: Social History[2]  Family Medical History: No family history on file.  Physical Examination: Vitals:   02/22/24 1309  BP: 102/60    General: Patient is in no apparent distress. Attention to examination is appropriate.  Neck:   Supple.  Full range of motion.  Respiratory: Patient is breathing without any difficulty.   NEUROLOGICAL:     Awake, alert, oriented to person, place, and time.  Speech is clear and fluent.   Cranial Nerves: Pupils equal round and reactive to light.  Facial tone is symmetric.  Facial sensation is symmetric. Shoulder shrug is symmetric. Tongue protrusion is midline.  There is no pronator drift.  Strength: Side Biceps Triceps Deltoid Interossei Grip Wrist Ext. Wrist Flex.  R 5 5 5 5 5 5 5   L 5 5 5 5 5 5 5    Side Iliopsoas Quads Hamstring PF DF EHL  R 5 5 5 5 5 5   L 5 5 5 5 5 5    Reflexes are 1+ and symmetric at the biceps, triceps, brachioradialis, patella and achilles.   Hoffman's is absent.   Bilateral upper and lower extremity sensation is intact to light touch.    No evidence of dysmetria noted.  Gait is stooped and requires a cane.     Medical Decision Making  Imaging: MRI L spine 05/08/2023 Disc levels:   L1-L2: Disc bulge. Mild bilateral facet arthropathy. Moderate bilateral neuroforaminal stenosis. Moderate spinal canal stenosis.   L2-L3: Disc bulge. Moderate bilateral facet arthropathy. Moderate bilateral neuroforaminal stenosis. Moderate spinal canal stenosis.  L3-L4: Disc bulge. Severe bilateral facet arthropathy. Moderate bilateral neuroforaminal stenosis. Severe spinal canal stenosis.   L4-L5: Disc bulge. Severe bilateral facet arthropathy. Moderate bilateral neuroforaminal stenosis. Moderate spinal canal stenosis.   L5-S1: Disc bulge. Moderate bilateral facet arthropathy. Severe bilateral neuroforaminal stenosis. No spinal canal  stenosis. IMPRESSION: 1. 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 Moderate canal stenosis at several levels in the lumbar spine. 2. Moderate and severe foraminal stenosis throughout the lumbar spine secondary to disc bulging and facet arthropathy.     Electronically Signed   By: Clem Savory M.D.   On: 06/03/2023 11:49  I have personally reviewed the images and agree with the above interpretation.  Assessment and Plan: Mr. 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  He has tried and failed conservative management.  No further conservative management is indicated.  I recommended L2-5 decompression.  I do not feel that the L1-2 findings are as bad as the radiologist above.  I discussed the planned procedure at length with the patient, including the risks, benefits, alternatives, and indications. The risks discussed include but are not limited to bleeding, infection, need for reoperation, spinal fluid leak, stroke, vision loss, anesthetic complication, coma, paralysis, and even death. I also described in detail that improvement was not guaranteed.  The patient expressed understanding of these risks, and asked that we proceed with surgery. I described the surgery in layman's terms, and gave ample opportunity for questions, which were answered to the best of my ability.   I spent a total of 30 minutes in this patient's care today. This time was spent reviewing pertinent records including imaging studies, obtaining and confirming history, performing a directed evaluation, formulating and discussing my recommendations, and documenting the visit within the medical record.      Thank you for involving me in the care of this patient.      Lindzy Rupert K. Clois MD, MPHS Neurosurgery     [1]  Current Outpatient Medications:    Cholecalciferol 25 MCG (1000 UT) tablet, Take by mouth., Disp: , Rfl:    Ciclopirox 1 % shampoo, ,  Disp: , Rfl:    citalopram (CELEXA) 20 MG tablet, Take 30 mg by mouth every morning., Disp: , Rfl:    fluticasone (FLONASE) 50 MCG/ACT nasal spray, SPRAY TWICE IN EACH NOSTRIL ONCE DAILY AT BEDTIME, Disp: , Rfl:    Krill Oil (OMEGA-3) 500 MG CAPS, Take 1 capsule by mouth daily., Disp: , Rfl:    meloxicam (MOBIC) 15 MG tablet, Take 15 mg by mouth daily., Disp: , Rfl:    rosuvastatin (CRESTOR) 5 MG tablet, Take 5 mg by mouth 3 (three) times a week., Disp: , Rfl:    tamsulosin (FLOMAX) 0.4 MG CAPS capsule, tamsulosin 0.4 mg capsule, Disp: , Rfl:  [2]  Social History Tobacco Use   Smoking status: Former    Average packs/day: 1.5 packs/day for 40.0 years (60.0 ttl pk-yrs)    Types: Cigarettes    Start date: 1968   Smokeless tobacco: Never   Tobacco comments:    Smoked cigarettes since he was 21 off and on-quit 2008  Substance Use Topics   Alcohol use: Not Currently   Drug use: Never   "

## 2024-02-22 ENCOUNTER — Encounter: Payer: Self-pay | Admitting: Neurosurgery

## 2024-02-22 ENCOUNTER — Ambulatory Visit: Admitting: Neurosurgery

## 2024-02-22 ENCOUNTER — Other Ambulatory Visit: Payer: Self-pay

## 2024-02-22 VITALS — BP 102/60 | Ht 64.0 in | Wt 235.5 lb

## 2024-02-22 DIAGNOSIS — M48062 Spinal stenosis, lumbar region with neurogenic claudication: Secondary | ICD-10-CM

## 2024-02-22 DIAGNOSIS — Z01818 Encounter for other preprocedural examination: Secondary | ICD-10-CM

## 2024-02-22 NOTE — Patient Instructions (Signed)
 Please see below for information in regards to your upcoming surgery:   Planned surgery: L2-5 Posterior Spinal Decompression   Surgery date: 04/05/24 at [facility] (Medical Mall: [address], [place], [place] [address]) - you will find out your arrival time the business day before your surgery.   Pre-op appointment at [facility] Pre-admit Testing: you will receive a call with a date/time for this appointment. If you are scheduled for an in person appointment, Pre-admit Testing is located on the first floor of the [facility] building, [address] [address] [facility] [address], [address]. During this appointment, they will advise you which medications you can take the morning of surgery, and which medications you will need to hold for surgery. Labs (such as blood work, EKG) may be done at your pre-op appointment. You are not required to fast for these labs. Should you need to change your pre-op appointment, please call Pre-admit testing at [phone].   Please bring your medication bottles or an up to date medication list to your pre-admit testing appointment (regardless of whether we have a list in your chart).     Surgical clearance: we will send a clearance form to Norleen Boop, MD. They may wish to see you in their office prior to signing the clearance form. If so, they may call you to schedule an appointment.     Common restrictions after spine surgery: No bending, lifting, or twisting (BLT). Avoid lifting objects heavier than 10 pounds for the first 6 weeks after surgery. Where possible, avoid household activities that involve lifting, bending, reaching, pushing, or pulling such as laundry, vacuuming, grocery shopping, and childcare. Try to arrange for help from friends and family for these activities while you heal. Do not drive while taking prescription pain medication. Weeks 6 through 12 after surgery: avoid lifting more than 25 pounds.     How to contact us :   If you have any questions/concerns before or after surgery, you can reach us  at [phone], or you can send a mychart message. We can be reached by phone or mychart 8am-4pm, Monday-Friday.  *Please note: Calls after 4pm are forwarded to a third party answering service. Mychart messages are not routinely monitored during evenings, weekends, and holidays. Please call our office to contact the answering service for urgent concerns during non-business hours.    If you have FMLA/disability paperwork, please drop it off or fax it to [phone]   Appointments/FMLA & disability paperwork: Reche Hait, & Nichole Registered Nurse/Surgery scheduler: Kendelyn, RN & Katie, RN Certified Medical Assistants: Don, CMA, Elenor, CMA, Damien, CMA, & Auston, [place] Physician Assistants: Lyle Decamp, PA-C, Edsel Goods, PA-C & Glade Boys, PA-C Surgeons: Penne Sharps, MD & Reeves Daisy, MD   [facility] PREADMIT TESTING VISIT and SURGERY INFORMATION SHEET   Now that surgery has been scheduled you can anticipate several phone calls from [facility] services. A pharmacy technician will call you to verify your current list of medications taken at home.               The Pre-Service Center will call to verify your insurance information and to give you billing estimates and information.             The Preadmit Testing Office will be calling to schedule a visit to obtain information for the anesthesia team and provide instructions on preparation for surgery.  What can you expect for the Preadmit Testing Visit: Appointments may be scheduled in-person or by telephone.  If a telephone visit is scheduled, you may be asked to come into the office to have lab tests or other studies performed.   This visit will not be completed any greater than 14 days prior to your surgery.  If your surgery has been scheduled for a future date, please do not be alarmed if we have not contacted you to  schedule an appointment more than a month prior to the surgery date.    Please be prepared to provide the following information during this appointment:            -Personal medical history                                               -Medication and allergy list            -Any history of problems with anesthesia              -Recent lab work or diagnostic studies            -Please notify us  of any needs we should be aware of to provide the best care possible           -You will be provided with instructions on how to prepare for your surgery.    On The Day of Surgery:  You must have a driver to take you home after surgery, you will be asked not to drive for 24 hours following surgery.  Taxi, Gisele and non-medical transport will not be acceptable means of transportation unless you have a responsible individual who will be traveling with you.  Visitors in the surgical area:   2 people will be able to visit you in your room once your preparation for surgery has been completed. During surgery, your visitors will be asked to wait in the Surgery Waiting Area.  It is not a requirement for them to stay, if they prefer to leave and come back.  Your visitor(s) will be given an update once the surgery has been completed.  No visitors are allowed in the initial recovery room to respect patient privacy and safety.  Once you are more awake and transfer to the secondary recovery area, or are transferred to an inpatient room, visitors will again be able to see you.  To respect and protect your privacy: We will ask on the day of surgery who your driver will be and what the contact number for that individual will be. We will ask if it is okay to share information with this individual, or if there is an alternative individual that we, or the surgeon, should contact to provide updates and information. If family or friends come to the surgical information desk requesting information about you, who you have not  listed with us , no information will be given.   It may be helpful to designate someone as the main contact who will be responsible for updating your other friends and family.    PREADMIT TESTING OFFICE: [phone] SAME DAY SURGERY: [phone] We look forward to caring for you before and throughout the process of your surgery.

## 2024-04-05 ENCOUNTER — Ambulatory Visit: Admit: 2024-04-05 | Admitting: Neurosurgery

## 2024-04-05 SURGERY — LUMBAR LAMINECTOMY/DECOMPRESSION MICRODISCECTOMY 3 LEVELS
Anesthesia: General

## 2024-04-19 ENCOUNTER — Encounter: Admitting: Orthopedic Surgery

## 2024-05-16 ENCOUNTER — Encounter: Admitting: Neurosurgery

## 2024-06-26 ENCOUNTER — Encounter: Admitting: Orthopedic Surgery
